# Patient Record
Sex: Female | Born: 1990
Health system: Southern US, Community
[De-identification: ages and names within clinical notes are randomized; demographics above are authoritative.]

---
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
## 2014-09-09 ENCOUNTER — Emergency Department (HOSPITAL_COMMUNITY): Payer: Self-pay

## 2014-09-09 ENCOUNTER — Encounter (HOSPITAL_COMMUNITY): Payer: Self-pay | Admitting: Emergency Medicine

## 2014-09-09 ENCOUNTER — Emergency Department (HOSPITAL_COMMUNITY)
Admission: EM | Admit: 2014-09-09 | Discharge: 2014-09-10 | Disposition: A | Discharge status: Home / Self Care | Payer: Self-pay | Attending: Emergency Medicine | Admitting: Emergency Medicine

## 2014-09-09 DIAGNOSIS — Z3202 Encounter for pregnancy test, result negative: Secondary | ICD-10-CM | POA: Insufficient documentation

## 2014-09-09 DIAGNOSIS — S32511A Fracture of superior rim of right pubis, initial encounter for closed fracture: Secondary | ICD-10-CM | POA: Insufficient documentation

## 2014-09-09 DIAGNOSIS — Y9289 Other specified places as the place of occurrence of the external cause: Secondary | ICD-10-CM | POA: Insufficient documentation

## 2014-09-09 DIAGNOSIS — S32591A Other specified fracture of right pubis, initial encounter for closed fracture: Secondary | ICD-10-CM

## 2014-09-09 DIAGNOSIS — Y939 Activity, unspecified: Secondary | ICD-10-CM | POA: Insufficient documentation

## 2014-09-09 DIAGNOSIS — T1490XA Injury, unspecified, initial encounter: Secondary | ICD-10-CM

## 2014-09-09 DIAGNOSIS — S0181XA Laceration without foreign body of other part of head, initial encounter: Secondary | ICD-10-CM | POA: Insufficient documentation

## 2014-09-09 DIAGNOSIS — Y999 Unspecified external cause status: Secondary | ICD-10-CM | POA: Insufficient documentation

## 2014-09-09 LAB — CBC WITH DIFFERENTIAL/PLATELET
BASOS ABS: 0 10*3/uL (ref 0.0–0.1)
Basophils Relative: 0 % (ref 0–1)
EOS PCT: 1 % (ref 0–5)
Eosinophils Absolute: 0.1 10*3/uL (ref 0.0–0.7)
HCT: 33.7 % — ABNORMAL LOW (ref 36.0–46.0)
Hemoglobin: 10.8 g/dL — ABNORMAL LOW (ref 12.0–15.0)
LYMPHS PCT: 26 % (ref 12–46)
Lymphs Abs: 3.1 10*3/uL (ref 0.7–4.0)
MCH: 25.4 pg — AB (ref 26.0–34.0)
MCHC: 32 g/dL (ref 30.0–36.0)
MCV: 79.1 fL (ref 78.0–100.0)
Monocytes Absolute: 0.6 10*3/uL (ref 0.1–1.0)
Monocytes Relative: 5 % (ref 3–12)
NEUTROS ABS: 8 10*3/uL — AB (ref 1.7–7.7)
NEUTROS PCT: 68 % (ref 43–77)
Platelets: 380 10*3/uL (ref 150–400)
RBC: 4.26 MIL/uL (ref 3.87–5.11)
RDW: 16 % — ABNORMAL HIGH (ref 11.5–15.5)
WBC: 11.9 10*3/uL — ABNORMAL HIGH (ref 4.0–10.5)

## 2014-09-09 LAB — COMPREHENSIVE METABOLIC PANEL
ALBUMIN: 3.8 g/dL (ref 3.5–5.2)
ALK PHOS: 53 U/L (ref 39–117)
ALT: 21 U/L (ref 0–35)
ANION GAP: 6 (ref 5–15)
AST: 36 U/L (ref 0–37)
BILIRUBIN TOTAL: 0.4 mg/dL (ref 0.3–1.2)
BUN: 11 mg/dL (ref 6–23)
CHLORIDE: 105 mmol/L (ref 96–112)
CO2: 26 mmol/L (ref 19–32)
Calcium: 9 mg/dL (ref 8.4–10.5)
Creatinine, Ser: 0.85 mg/dL (ref 0.50–1.10)
GFR calc Af Amer: >90 mL/min (ref >90)
GFR calc non Af Amer: >90 mL/min (ref >90)
Glucose, Bld: 87 mg/dL (ref 70–99)
POTASSIUM: 3.8 mmol/L (ref 3.5–5.1)
Sodium: 137 mmol/L (ref 135–145)
Total Protein: 6.7 g/dL (ref 6.0–8.3)

## 2014-09-09 LAB — POC URINE PREG, ED: PREG TEST UR: NEGATIVE

## 2014-09-09 LAB — TYPE AND SCREEN
ABO/RH(D): O NEG
Antibody Screen: NEGATIVE

## 2014-09-09 LAB — ABO/RH: ABO/RH(D): O NEG

## 2014-09-09 MED ORDER — MORPHINE SULFATE 2 MG/ML IJ SOLN
INTRAMUSCULAR | Status: AC
  Filled 2014-09-09: qty 1

## 2014-09-09 MED ORDER — MORPHINE SULFATE 2 MG/ML IJ SOLN
2.0000 mg | Freq: Once | INTRAMUSCULAR | Status: AC
  Administered 2014-09-09: 2 mg via INTRAVENOUS

## 2014-09-09 MED ORDER — SODIUM CHLORIDE 0.9 % IV BOLUS (SEPSIS)
1000.0000 mL | Freq: Once | INTRAVENOUS | Status: AC
  Administered 2014-09-09: 1000 mL via INTRAVENOUS

## 2014-09-09 MED ORDER — HYDROMORPHONE HCL 1 MG/ML IJ SOLN
INTRAMUSCULAR | Status: AC
  Filled 2014-09-09: qty 1

## 2014-09-09 MED ORDER — HYDROMORPHONE HCL 1 MG/ML IJ SOLN
0.5000 mg | Freq: Once | INTRAMUSCULAR | Status: AC
  Administered 2014-09-09: 0.5 mg via INTRAVENOUS
  Filled 2014-09-09: qty 1

## 2014-09-09 MED ORDER — LIDOCAINE HCL (PF) 1 % IJ SOLN
10.0000 mL | Freq: Once | INTRAMUSCULAR | Status: AC
  Administered 2014-09-09: 10 mL
  Filled 2014-09-09: qty 10

## 2014-09-09 NOTE — ED Notes (Signed)
CT called to inquire about CT scan.  

## 2014-09-09 NOTE — ED Notes (Signed)
Duplicate order discontinued.  

## 2014-09-09 NOTE — Progress Notes (Signed)
°   09/09/14 2200  Clinical Encounter Type  Visited With Patient;Family;Patient and family together  Visit Type Follow-up;Spiritual support  Referral From Nurse  Spiritual Encounters  Spiritual Needs Emotional  Stress Factors  Family Stress Factors Lack of knowledge   Responded to page as patient's mother Lurena JoinerRebecca arrived and wanted to see her daughter. Spoke with family in the waiting room and let them know that they could visit two at a time. Patient's boyfriend had left the room so patient's mother and sister were directed to the patient's room. Patient was alert and had just returned earlier from having a test run. Patient was glad to see her mother; not like the story that was told earlier of strained relationships. Patient's friends will visit her two at a time and Chaplain will be paged if patient has any further needs.  Lorna FewChristina Yarbrough, Chaplain Intern 09/09/2014, 10:57 PM

## 2014-09-09 NOTE — ED Notes (Signed)
CT called to inquire about CT scans.  
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
## 2014-09-09 NOTE — ED Notes (Signed)
Pt reporting cramping in her lower abdomen, states her pain is radiating from her groin. MD informed.
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
## 2014-09-10 ENCOUNTER — Emergency Department (HOSPITAL_COMMUNITY): Payer: Self-pay

## 2014-09-10 MED ORDER — IOHEXOL 300 MG/ML  SOLN
80.0000 mL | Freq: Once | INTRAMUSCULAR | Status: AC | PRN
  Administered 2014-09-10: 80 mL via INTRAVENOUS

## 2014-09-10 MED ORDER — MORPHINE SULFATE 4 MG/ML IJ SOLN
4.0000 mg | Freq: Once | INTRAMUSCULAR | Status: AC
  Administered 2014-09-10: 4 mg via INTRAVENOUS
  Filled 2014-09-10: qty 1

## 2014-09-10 MED ORDER — OXYCODONE-ACETAMINOPHEN 5-325 MG PO TABS
1.0000 | ORAL_TABLET | Freq: Once | ORAL | Status: AC
Start: 2014-09-10 — End: 2014-09-10
  Administered 2014-09-10: 1 via ORAL
  Filled 2014-09-10: qty 1

## 2014-09-10 MED ORDER — OXYCODONE-ACETAMINOPHEN 5-325 MG PO TABS: 1.0000 | ORAL_TABLET | ORAL | Status: AC | PRN

## 2014-09-10 NOTE — ED Notes (Signed)
Ortho never came to department to see patient

## 2014-09-10 NOTE — ED Notes (Signed)
PT stable, ambulatory, pain decreased 3/10

## 2014-09-10 NOTE — ED Notes (Signed)
MD at bedside. 

## 2014-09-10 NOTE — ED Notes (Signed)
Pt ambulated without difficulty

## 2014-09-10 NOTE — ED Notes (Signed)
Signature pad in room broken, pt understood discharge instructions

## 2014-09-10 NOTE — ED Notes (Signed)
No neuro consult placed.

## 2014-09-10 NOTE — Discharge Instructions (Signed)
° °  Thank you for allowing us to take care of you today.  You will need to follow up with the orthopedic doctor in one week.  You will need to return in 5 days to this ER or to an urgent care center to have your facial stitches removed. Stable Pelvic Fracture You have one or more fractures (this means there is a break in the bones) of the pelvis. The pelvis is the ring of bones that make up your hipbones. These are the bones you sit on and the lower part of the spine. It is like a boney ring where your legs attach and which supports your upper body. You have an undisplaced fracture. This means the bones are in good position. The pelvic fracture you have is a simple (uncomplicated) fracture. DIAGNOSIS  X-rays usually diagnose these fractures. TREATMENT  The goal of treating pelvic fractures is to get the bones to heal in a good position. The patient should return to normal activities as soon as possible. Such fractures are often treated with normal bed rest and conservative measures.  HOME CARE INSTRUCTIONS   You should be on bed rest for as long as directed by your caregiver. Change positions of your legs every 1-2 hours to maintain good blood flow. You may sit as long as is tolerable. Following this, you may do usual activities, but avoid strenuous activities for as long as directed by your caregiver.  Only take over-the-counter or prescription medicines for pain, discomfort, or fever as directed by your caregiver.  Bed rest may also be used for discomfort.  Resume your activities when you are able. Use a cane or crutch on the injured side to reduce pain while walking, as needed.  If you develop increased pain or discomfort not relieved with medications, contact your caregiver.  Warning: Do not drive a car or operate a motor vehicle until your caregiver specifically tells you it is safe to do so. SEEK IMMEDIATE MEDICAL CARE IF:   You feel light-headed or faint, develop chest pain or shortness  of breath.  An unexplained oral temperature above 102 F (38.9 C) develops.  You develop blood in the urine or in the stools.  There is difficulty urinating, and/or having a bowel movement, or pain with these efforts.  There is a difficulty or increased pain with walking.  There is swelling in one or both legs that is not normal. Document Released: 08/12/2001 Document Revised: 10/18/2013 Document Reviewed: 01/15/2008 [facility] Patient Information 2015 [employer] [place]. This information is not intended to replace advice given to you by your health care provider. Make sure you discuss any questions you have with your health care provider.
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
## 2023-08-14 ENCOUNTER — Encounter: Payer: BC Managed Care – PPO | Admitting: Nurse Practitioner

## 2023-08-14 ENCOUNTER — Other Ambulatory Visit: Payer: BC Managed Care – PPO

## 2023-08-14 ENCOUNTER — Inpatient Hospital Stay: Payer: BC Managed Care – PPO

## 2023-08-15 ENCOUNTER — Inpatient Hospital Stay: Payer: BC Managed Care – PPO | Admitting: Oncology

## 2023-08-15 ENCOUNTER — Inpatient Hospital Stay: Payer: BC Managed Care – PPO

## 2023-08-15 ENCOUNTER — Other Ambulatory Visit: Payer: Self-pay | Admitting: Oncology

## 2023-08-15 DIAGNOSIS — D539 Nutritional anemia, unspecified: Secondary | ICD-10-CM

## 2023-08-15 NOTE — Progress Notes (Deleted)
 [facility]  [address] [place],  [place]  [address] [phone]  Clinic Day:  08/15/2023  Referring physician: Marylen Ponto, DO   HISTORY OF PRESENT ILLNESS:  The patient is a 33 y.o. female  who I was asked to consult upon for iron deficiency anemia.  Recent labs showed a low hemoglobin of 7.8, with a low MCV of 76.6.  Iron studies done recently showed a low ferritin of ***, a low serum iron of ***, a TIBC of ***, and a low iron saturation of ***.  The patient***overt forms of blood loss. ***past colonoscopy, which showed***  PAST MEDICAL HISTORY:  No past medical history on file.  PAST SURGICAL HISTORY:  No past surgical history on file.  CURRENT MEDICATIONS:   Current Outpatient Medications  Medication Sig Dispense Refill  . Multiple Vitamins-Minerals (HAIR/SKIN/NAILS) TABS Take 1 tablet by mouth daily.    Marland Kitchen oxyCODONE-acetaminophen (PERCOCET/ROXICET) 5-325 MG per tablet Take 1 tablet by mouth every 4 (four) hours as needed for severe pain. 30 tablet 0   No current facility-administered medications for this visit.    ALLERGIES:  No Known Allergies  FAMILY HISTORY:  No family history on file.  SOCIAL HISTORY:   reports that she has been smoking cigarettes. She does not have any smokeless tobacco history on file. She reports that she does not drink alcohol and does not use drugs.  REVIEW OF SYSTEMS:  Review of Systems - Oncology   PHYSICAL EXAM:  There were no vitals taken for this visit. Wt Readings from Last 3 Encounters:  09/09/14 109 lb (49.4 kg)   There is no height or weight on file to calculate BMI. Performance status (ECOG): {CHL ONC [id number] Physical Exam  LABS:      Latest Ref Rng & Units 09/09/2014    8:59 PM  CBC  WBC 4.0 - 10.5 K/uL 11.9   Hemoglobin 12.0 - 15.0 g/dL 91.4   Hematocrit 78.2 - 46.0 % 33.7   Platelets 150 - 400 K/uL 380       Latest Ref Rng & Units 09/09/2014    8:59 PM  06/11/2010    6:25 AM  CMP  Glucose 70 - 99 mg/dL 87    BUN 6 - 23 mg/dL 11    Creatinine 9.56 - 1.10 mg/dL 2.13    Sodium 086 - 578 mmol/L 137    Potassium 3.5 - 5.1 mmol/L 3.8    Chloride 96 - 112 mmol/L 105    CO2 19 - 32 mmol/L 26    Calcium 8.4 - 10.5 mg/dL 9.0    Total Protein 6.0 - 8.3 g/dL 6.7  6.7   Total Bilirubin 0.3 - 1.2 mg/dL 0.4  0.5   Alkaline Phos 39 - 117 U/L 53  64   AST 0 - 37 U/L 36  15   ALT 0 - 35 U/L 21  10      No results found for: "CEA1", "CEA" / No results found for: "CEA1", "CEA" No results found for: "PSA1" No results found for: "ION629" No results found for: "CAN125"  No results found for: "TOTALPROTELP", "ALBUMINELP", "A1GS", "A2GS", "BETS", "BETA2SER", "GAMS", "MSPIKE", "SPEI" No results found for: "TIBC", "FERRITIN", "IRONPCTSAT" No results found for: "LDH"  No results found for: "AFPTUMOR", "TOTALPROTELP", "ALBUMINELP", "A1GS", "A2GS", "BETS", "BETA2SER", "GAMS", "MSPIKE", "SPEI", "LDH", "CEA1", "CEA", "PSA1", "IGASERUM", "IGGSERUM", "IGMSERUM", "THGAB", "THYROGLB"  Review Flowsheet        No data to display  STUDIES:  No results found.   ASSESSMENT & PLAN:  A 33 y.o. female who I was asked to consult upon for iron deficiency anemia.  I will arrange for him/her to receive IV iron over these next few weeks to rapidly replenish his/her iron stores and normalize his/her hemoglobin.  I will see him/her back in 3 months to reassess her iron and hemoglobin levels to see how well he/she responded to his/her upcoming IV iron.  The patient understands all the plans discussed today and is in agreement with them.  I do appreciate Mannino, Marylene Land, DO for his new consult.   Josuha Fontanez Kirby Funk, MD

## 2023-09-04 ENCOUNTER — Inpatient Hospital Stay

## 2023-09-04 ENCOUNTER — Inpatient Hospital Stay: Admitting: Hematology and Oncology

## 2023-09-23 ENCOUNTER — Telehealth: Payer: Self-pay | Admitting: Hematology and Oncology

## 2023-09-23 NOTE — Telephone Encounter (Signed)
 09/23/23 Spoke with patient and rescheduled New Patient consult appt.

## 2023-10-01 ENCOUNTER — Inpatient Hospital Stay: Admitting: Hematology and Oncology

## 2023-10-01 ENCOUNTER — Inpatient Hospital Stay: Attending: Oncology
